# Patient Record
Sex: Female | Born: 1960 | Race: White | Hispanic: No | Marital: Married
Health system: Southern US, Community
[De-identification: ages and names within clinical notes are randomized; demographics above are authoritative.]

---

## 2018-11-11 ENCOUNTER — Other Ambulatory Visit: Payer: Self-pay

## 2018-11-11 DIAGNOSIS — K5730 Diverticulosis of large intestine without perforation or abscess without bleeding: Secondary | ICD-10-CM | POA: Diagnosis not present

## 2018-11-11 DIAGNOSIS — K5900 Constipation, unspecified: Secondary | ICD-10-CM | POA: Diagnosis not present

## 2018-11-11 DIAGNOSIS — R600 Localized edema: Secondary | ICD-10-CM | POA: Insufficient documentation

## 2018-11-11 DIAGNOSIS — R1031 Right lower quadrant pain: Secondary | ICD-10-CM | POA: Diagnosis present

## 2018-11-11 LAB — URINALYSIS, COMPLETE (UACMP) WITH MICROSCOPIC
Bilirubin Urine: NEGATIVE
Glucose, UA: NEGATIVE mg/dL
Hgb urine dipstick: NEGATIVE
Ketones, ur: NEGATIVE mg/dL
Leukocytes, UA: NEGATIVE
Nitrite: NEGATIVE
Protein, ur: NEGATIVE mg/dL
Specific Gravity, Urine: 1.004 — ABNORMAL LOW (ref 1.005–1.030)
pH: 8 (ref 5.0–8.0)

## 2018-11-11 LAB — CBC
HCT: 34.8 % — ABNORMAL LOW (ref 36.0–46.0)
Hemoglobin: 11.6 g/dL — ABNORMAL LOW (ref 12.0–15.0)
MCH: 32.9 pg (ref 26.0–34.0)
MCHC: 33.3 g/dL (ref 30.0–36.0)
MCV: 98.6 fL (ref 80.0–100.0)
NRBC: 0 % (ref 0.0–0.2)
Platelets: 244 10*3/uL (ref 150–400)
RBC: 3.53 MIL/uL — ABNORMAL LOW (ref 3.87–5.11)
RDW: 14.3 % (ref 11.5–15.5)
WBC: 7.5 10*3/uL (ref 4.0–10.5)

## 2018-11-11 LAB — COMPREHENSIVE METABOLIC PANEL
ALT: 25 U/L (ref 0–44)
AST: 24 U/L (ref 15–41)
Albumin: 3.5 g/dL (ref 3.5–5.0)
Alkaline Phosphatase: 58 U/L (ref 38–126)
Anion gap: 6 (ref 5–15)
BUN: 13 mg/dL (ref 6–20)
CALCIUM: 9.9 mg/dL (ref 8.9–10.3)
CO2: 26 mmol/L (ref 22–32)
Chloride: 105 mmol/L (ref 98–111)
Creatinine, Ser: 1.08 mg/dL — ABNORMAL HIGH (ref 0.44–1.00)
GFR calc Af Amer: >60 mL/min (ref >60)
GFR calc non Af Amer: 56 mL/min — ABNORMAL LOW (ref >60)
Glucose, Bld: 102 mg/dL — ABNORMAL HIGH (ref 70–99)
Potassium: 4.2 mmol/L (ref 3.5–5.1)
SODIUM: 137 mmol/L (ref 135–145)
Total Bilirubin: 0.5 mg/dL (ref 0.3–1.2)
Total Protein: 7 g/dL (ref 6.5–8.1)

## 2018-11-11 LAB — LIPASE, BLOOD: Lipase: 22 U/L (ref 11–51)

## 2018-11-11 LAB — TROPONIN I: Troponin I: <0.03 ng/mL (ref <0.03)
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
## 2018-11-12 ENCOUNTER — Emergency Department: Payer: BLUE CROSS/BLUE SHIELD

## 2018-11-12 ENCOUNTER — Emergency Department
Admission: EM | Admit: 2018-11-12 | Discharge: 2018-11-12 | Disposition: A | Discharge status: Home / Self Care | Payer: BLUE CROSS/BLUE SHIELD | Attending: Emergency Medicine | Admitting: Emergency Medicine

## 2018-11-12 DIAGNOSIS — K5900 Constipation, unspecified: Secondary | ICD-10-CM

## 2018-11-12 DIAGNOSIS — K5730 Diverticulosis of large intestine without perforation or abscess without bleeding: Secondary | ICD-10-CM

## 2018-11-12 LAB — BRAIN NATRIURETIC PEPTIDE: B Natriuretic Peptide: 202 pg/mL — ABNORMAL HIGH (ref 0.0–100.0)

## 2018-11-12 MED ORDER — POLYETHYLENE GLYCOL 3350 17 G PO PACK
17.0000 g | PACK | Freq: Every day | ORAL | Status: DC
  Administered 2018-11-12: 17 g via ORAL

## 2018-11-12 MED ORDER — IOPAMIDOL (ISOVUE-300) INJECTION 61%
30.0000 mL | Freq: Once | INTRAVENOUS | Status: AC
  Administered 2018-11-12: 30 mL via ORAL

## 2018-11-12 NOTE — ED Notes (Signed)
Patient transported to CT 
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
## 2020-03-28 IMAGING — CT CT ABD-PELV W/O
2 of 4 series · 15 of 46 positions shown, 17 images · non-contrast
Comparison: None available.

CLINICAL DATA: Initial evaluation for acute abdominal pain.

EXAM:
CT ABDOMEN AND PELVIS WITHOUT CONTRAST
TECHNIQUE: Multidetector CT imaging of the abdomen and pelvis was performed
following the standard protocol without IV contrast.

[Series 2: routine abd/(person_name) (person_name) · axial · 0.70mm/px · z∈[-778,-388]mm · 12 of 90 slices shown, 14 images]
[im 8/90  soft-tissue]
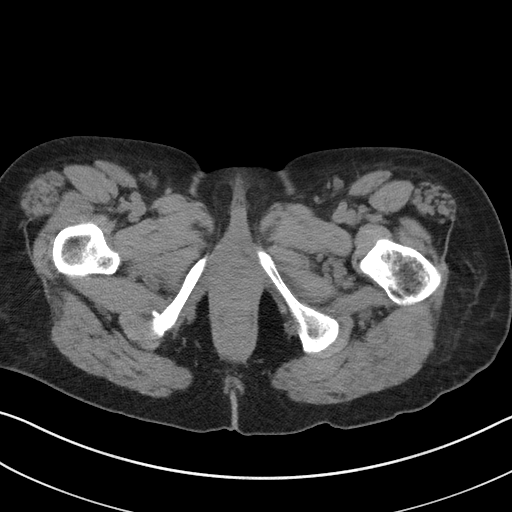
[im 8/90  bone]
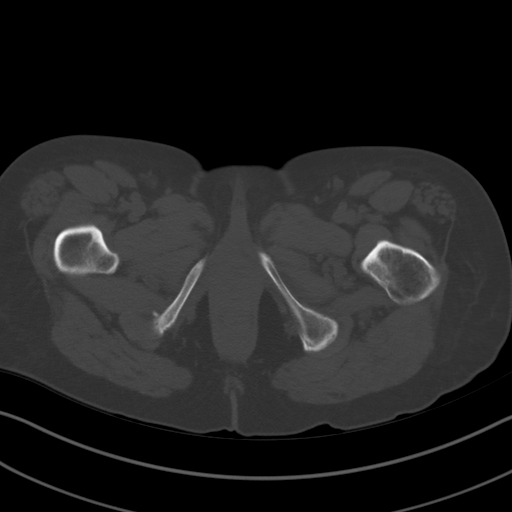
[im 15/90  soft-tissue]
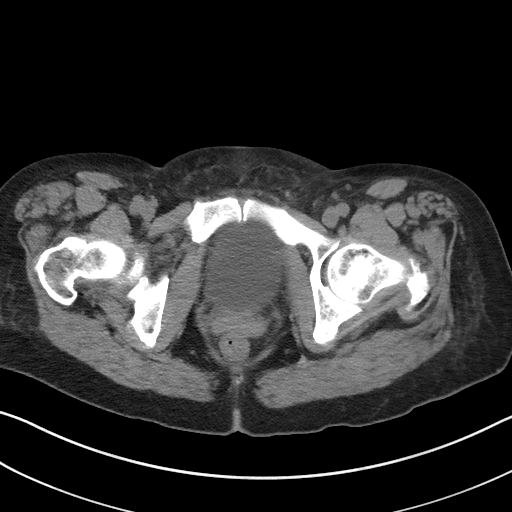
[im 22/90  soft-tissue]
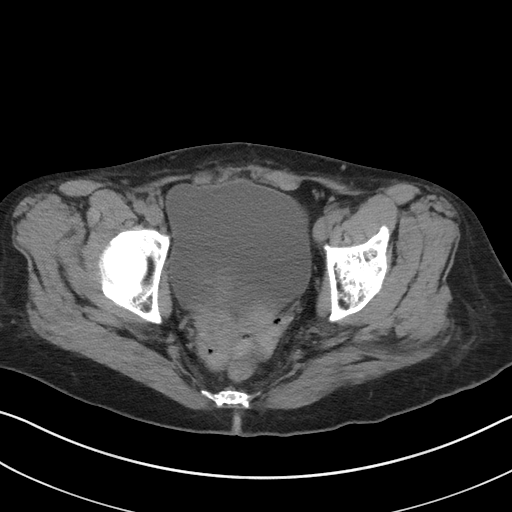
[im 29/90  soft-tissue]
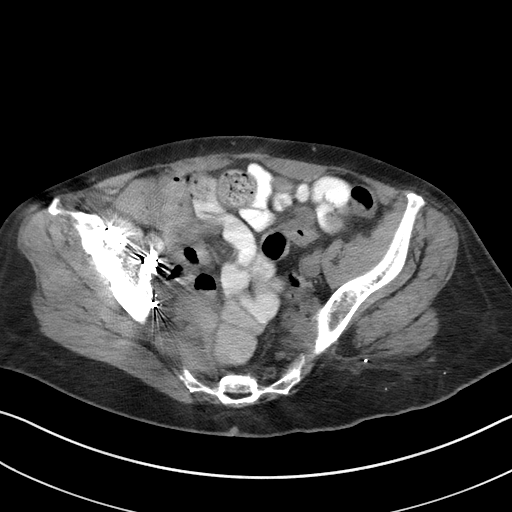
[im 36/90  soft-tissue]
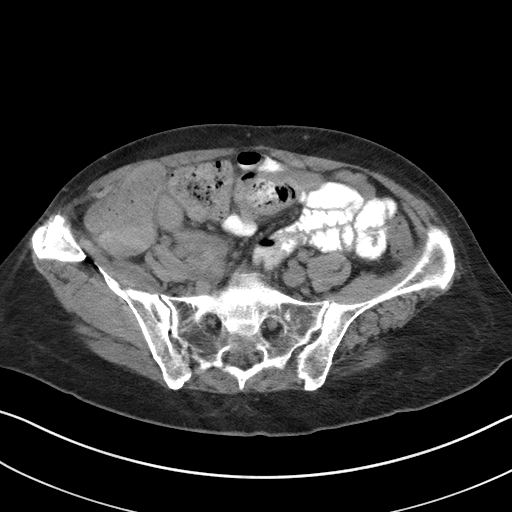
[im 43/90  soft-tissue]
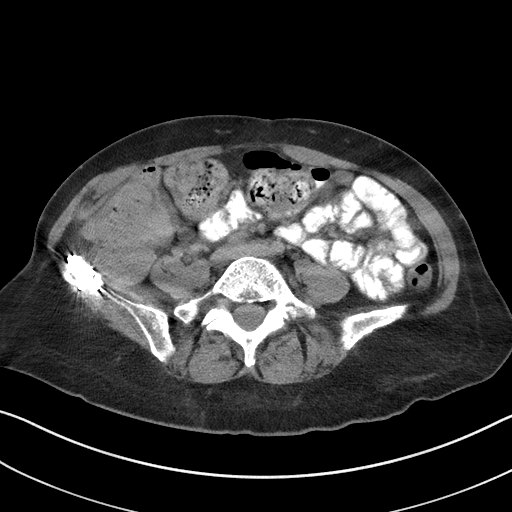
[im 50/90  soft-tissue]
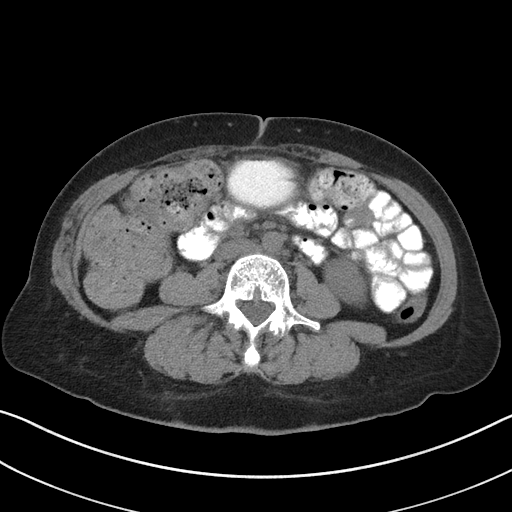
[im 57/90  soft-tissue]
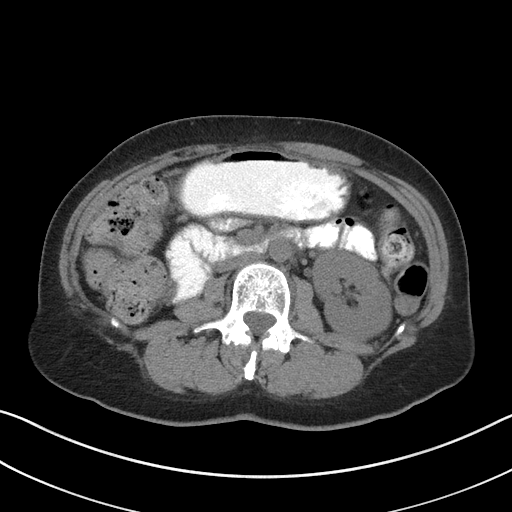
[im 65/90  soft-tissue]
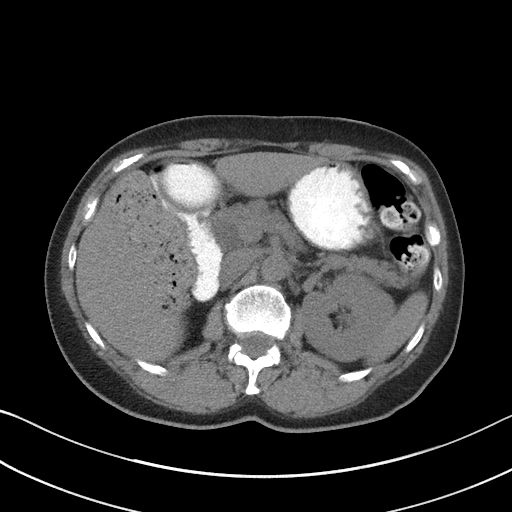
[im 65/90  bone]
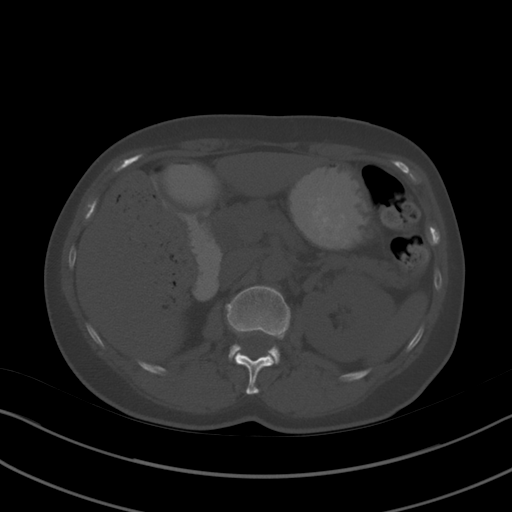
[im 72/90  soft-tissue]
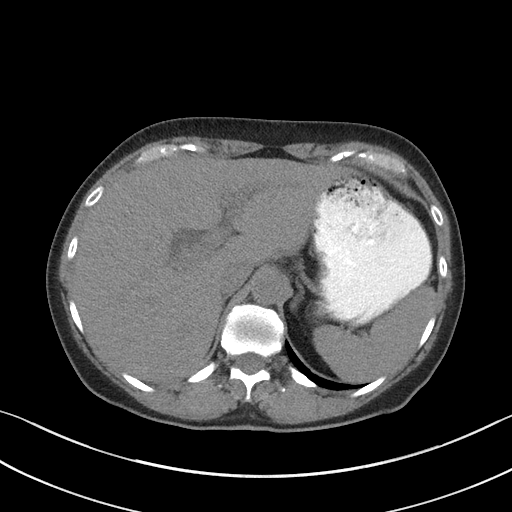
[im 79/90  soft-tissue]
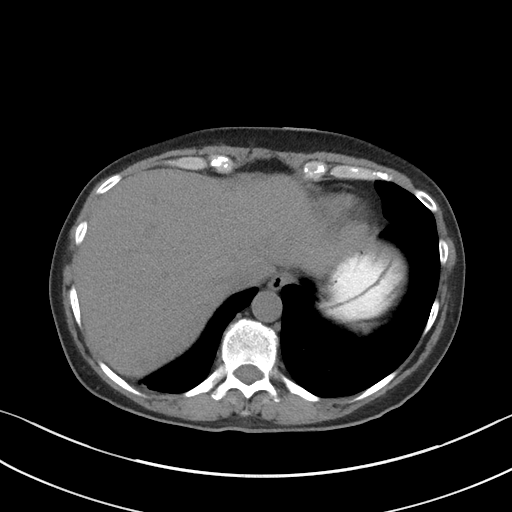
[im 86/90  soft-tissue]
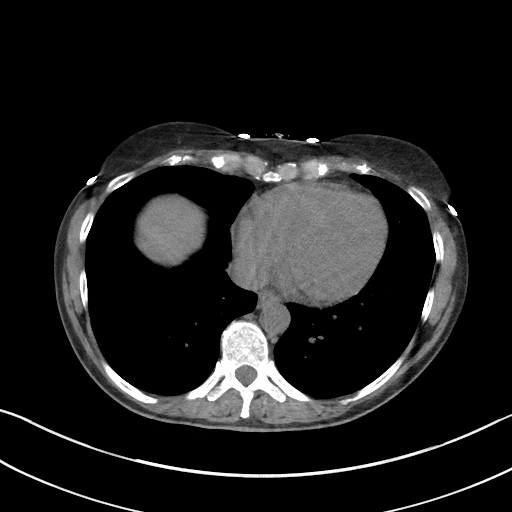

[Series 5: coronal st · coronal · 0.70mm/px · 3 of 69 slices shown]
[im 23/69  soft-tissue]
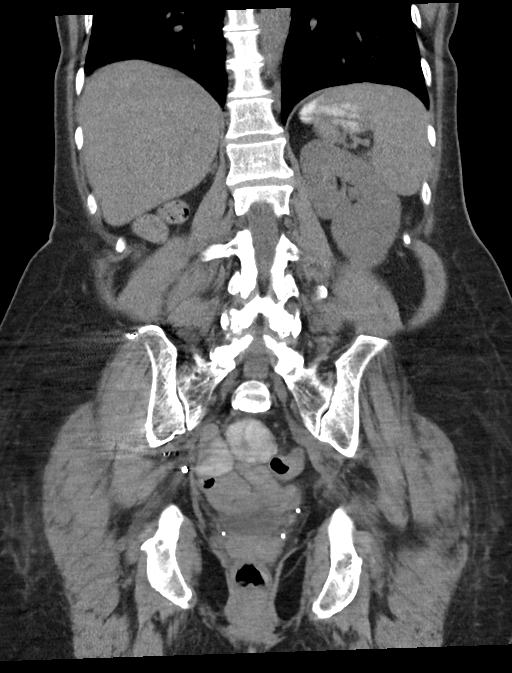
[im 31/69  soft-tissue]
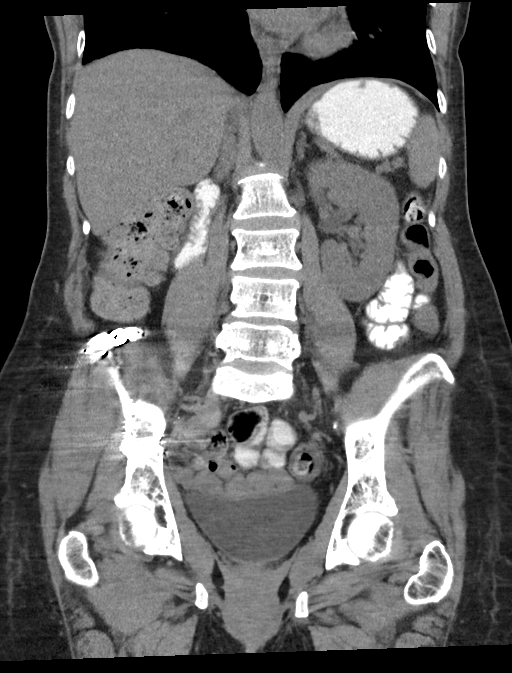
[im 38/69  soft-tissue]
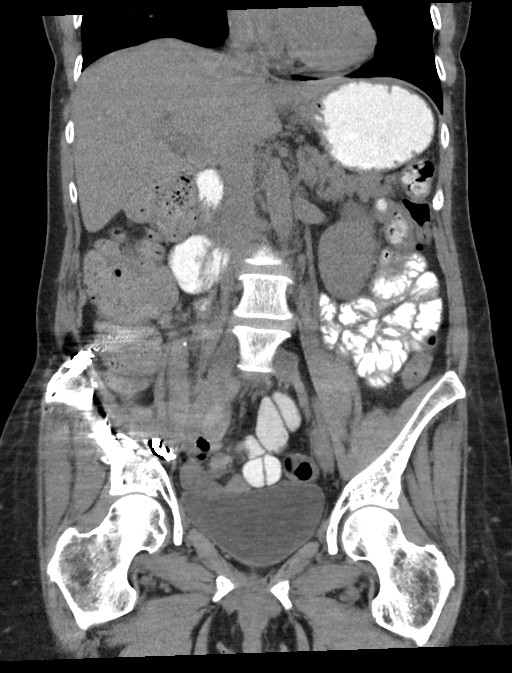

[15 of 46 positions shown; findings below may reference images not displayed]

FINDINGS: Lower chest: Visualized lung bases are clear.

Hepatobiliary: Subcentimeter hypodensities within the left hepatic
lobe, too small the characterize. Limited noncontrast evaluation
liver otherwise unremarkable. Prior cholecystectomy. Prominent
dilatation of the common bile duct up to 15 mm likely related post
cholecystectomy changes.

Pancreas: Pancreas within normal limits.

Spleen: Unremarkable.

Adrenals/Urinary Tract: Adrenal glands within normal limits. Right
kidney absent. Left kidney within normal limits without
nephrolithiasis or hydronephrosis. No radiopaque stones seen along
the course of the left renal collecting system. No left-sided
hydroureter. Partially distended bladder within normal limits.

Stomach/Bowel: Stomach within normal limits. No evidence for bowel
obstruction. Appendix not definitely visualized, however, no
inflammatory changes seen about the cecum or right lower quadrant to
suggest acute appendicitis. Large volume retained colonic stool
within the ascending and transverse colon, suggesting constipation.
Mild diverticulosis involves the lower descending colon without
evidence for acute diverticulitis. No acute inflammatory changes
seen about the bowels.

Vascular/Lymphatic: Intra-abdominal aorta of normal caliber. No
appreciable adenopathy.

Reproductive: Uterus appears to be absent. Ovaries not definitely
visualized.

Other: No free air or fluid. Heterogeneous fat necrosis in the
region of the left piriformis suspected to be chronic in nature and
related to previous trauma. The left piriformis muscle is atrophic
as are a portion of the left gluteal musculature.

Musculoskeletal: Sequelae of prior ORIF at the right iliac wing. No
acute osseous abnormality. No discrete lytic or blastic osseous
lesions.
IMPRESSION: 1. No CT evidence for acute intra-abdominal or pelvic process.
2. Mild colonic diverticulosis without evidence for acute
diverticulitis.
3. Large volume retained stool within the ascending and transverse
colon, suggesting constipation.
4. Prior cholecystectomy. Dilatation of the common bile duct felt to
be most consistent with post cholecystectomy changes.
